# Patient Record
Sex: Female | Born: 1995 | Race: Black or African American | Hispanic: No | Marital: Single | Smoking: Never smoker
Health system: Southern US, Community
[De-identification: ages and names within clinical notes are randomized; demographics above are authoritative.]

## PROBLEM LIST (undated history)

## (undated) DIAGNOSIS — Q251 Coarctation of aorta: Secondary | ICD-10-CM

## (undated) DIAGNOSIS — J302 Other seasonal allergic rhinitis: Secondary | ICD-10-CM

## (undated) DIAGNOSIS — Q2529 Other atresia of aorta: Secondary | ICD-10-CM

---
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
## 2016-01-13 ENCOUNTER — Telehealth: Payer: Self-pay | Admitting: Family Medicine

## 2016-01-13 NOTE — Telephone Encounter (Signed)
Needs o v with me first to clarify as much as possible

## 2016-01-13 NOTE — Telephone Encounter (Signed)
Pt would like to know since we sent her to Ortho an they did not find anything via their speciality for her aches on her left side ( it is an excruciating pain ) it stops her from sleeping and she feels she just isn't getting any answers from Ortho (she has seen two separate ones)   They feel maybe she needs to see a different specialist that she maybe can get a full body scan.   She starts school back on the 28th of Aug if we could do something before then.   Please advise

## 2016-01-13 NOTE — Telephone Encounter (Signed)
Left message to return call 

## 2016-01-15 NOTE — Telephone Encounter (Signed)
Left message to return call 

## 2016-01-21 NOTE — Telephone Encounter (Signed)
Left message to return call 

## 2016-01-21 NOTE — Telephone Encounter (Signed)
Discussed with pt. Pt transferred to front to schedule ov for recheck

## 2016-01-29 ENCOUNTER — Ambulatory Visit (INDEPENDENT_AMBULATORY_CARE_PROVIDER_SITE_OTHER): Payer: 59 | Admitting: Family Medicine

## 2016-01-29 ENCOUNTER — Encounter: Payer: Self-pay | Admitting: Family Medicine

## 2016-01-29 VITALS — BP 110/68 | Ht 66.0 in | Wt 188.0 lb

## 2016-01-29 DIAGNOSIS — M25552 Pain in left hip: Secondary | ICD-10-CM | POA: Diagnosis not present

## 2016-01-29 DIAGNOSIS — G8929 Other chronic pain: Secondary | ICD-10-CM | POA: Diagnosis not present

## 2016-01-29 NOTE — Progress Notes (Signed)
   Subjective:    Patient ID: Tina Miller, female    DOB: 07-Jul-1995, 20 y.o.   MRN: [id number]  Leg Pain   The incident occurred more than 1 week ago. The pain is present in the left leg. Treatments tried: Orthopedic specialist.    Micah FlesherWent to ortho doctor a couple yrs ago  "they did not find anything"  Pain almost always at night, bad ache at times, popping sensation in the leg. Hurts really bad at times  Also went to see a neurologist, they wanted to consider nerve conduction studies  During day will ache in leg if sits for a long time  No reg ercise,   Patient in today to discuss seeing a new orthopedic specialist for left leg pain. States not other concerns this visit.  Review of Systems No headache, no major weight loss or weight gain, no chest pain no back pain abdominal pain no change in bowel habits complete ROS otherwise negative     Objective:   Physical Exam  Alert vital stable lungs clear heart rare rhythm left leg pulses sensation intact reflexes intact negative straight leg raise internal and external rotation within normal limits      Assessment & Plan:  Impression puzzling persistent left hip pain. Popping sensation at times. Worse at rest. Multiple year duration. Frustrating the patient. Saw both orthopedist and neurologist in the past who are unable to pin down the exact cause. Would like to see another orthopedic doctor

## 2016-02-05 ENCOUNTER — Encounter: Payer: Self-pay | Admitting: Family Medicine

## 2016-02-29 ENCOUNTER — Ambulatory Visit (INDEPENDENT_AMBULATORY_CARE_PROVIDER_SITE_OTHER): Payer: 59 | Admitting: Osteopathic Medicine

## 2016-02-29 VITALS — BP 118/78 | HR 80 | Temp 99.6°F | Resp 16 | Ht 66.0 in | Wt 192.2 lb

## 2016-02-29 DIAGNOSIS — Z3049 Encounter for surveillance of other contraceptives: Secondary | ICD-10-CM

## 2016-02-29 DIAGNOSIS — Z7251 High risk heterosexual behavior: Secondary | ICD-10-CM | POA: Diagnosis not present

## 2016-02-29 LAB — POCT URINE PREGNANCY: Preg Test, Ur: NEGATIVE

## 2016-02-29 MED ORDER — MEDROXYPROGESTERONE ACETATE 150 MG/ML IM SUSP
150.0000 mg | Freq: Once | INTRAMUSCULAR | Status: AC
  Administered 2016-02-29: 150 mg via INTRAMUSCULAR

## 2016-02-29 NOTE — Progress Notes (Signed)
  HPI: Tina Miller is a 20 y.o.  female  who presents to [facility] today, 02/29/16,  for chief complaint of:  Chief Complaint  Patient presents with  . Depo-Provera injection    5 days over due     Overdue for Depo  Would also like checked for gonorrhea/chlamydia   Past medical, surgical, social and family history reviewed: Past Medical History:  Diagnosis Date  . Aortic atresia or stenosis, congenital 2011   [place], [place]  . Seasonal allergies    Past Surgical History:  Procedure Laterality Date  . ADENOIDECTOMY  June 2003  . CARDIAC CATHETERIZATION  2008  . LABIOPLASTY  11/19/2011   Procedure: LABIAPLASTY;  Surgeon: Jeani HawkingMichelle L Grewal, MD;  Location: [facility];  Service: Gynecology;  Laterality: Right;  Right Labial Reduction  . TONSILLECTOMY  June 2003   Social History  Substance Use Topics  . Smoking status: Never Smoker  . Smokeless tobacco: Never Used  . Alcohol use No   Family History  Problem Relation Age of Onset  . Aortic stenosis Mother   . Diabetes Other      Current medication list and allergy/intolerance information reviewed:   Current Outpatient Prescriptions  Medication Sig Dispense Refill  . medroxyPROGESTERone (DEPO-PROVERA) 150 MG/ML injection Next injection due 08/17/15 - 08/31/15. 1 mL 3   No current facility-administered medications for this visit.    No Known Allergies    Review of Systems:  Constitutional:  No  fever, no chills, No recent illness,   Genitourinary: No  incontinence, No  abnormal genital bleeding, No abnormal genital discharge  Skin: No  Rash  Exam:  BP 118/78 (BP Location: Right Arm, Patient Position: Sitting, Cuff Size: Normal)   Pulse 80   Temp 99.6 F (37.6 C) (Oral)   Resp 16   Ht 5\' 6"  (1.676 m)   Wt 192 lb 3.2 oz (87.2 kg)   SpO2 96%   BMI 31.02 kg/m   Constitutional: VS see above. General Appearance: alert, well-developed, well-nourished, NAD   Results for orders placed or  performed in visit on 02/29/16 (from the past 72 hour(s))  POCT urine pregnancy     Status: None   Collection Time: 02/29/16 12:22 PM  Result Value Ref Range   Preg Test, Ur Negative Negative      ASSESSMENT/PLAN:   Encounter for surveillance of other contraceptive - Plan: GC/Chlamydia Probe Amp, POCT urine pregnancy, medroxyPROGESTERone (DEPO-PROVERA) injection 150 mg  Unprotected sex - Plan: GC/Chlamydia Probe Amp     Visit summary with medication list and pertinent instructions was printed for patient to review. All questions at time of visit were answered - patient instructed to contact office with any additional concerns. ER/RTC precautions were reviewed with the patient. Follow-up plan: Return in about 3 months (around 05/30/2016), or sooner if needed, for DEPO SHOT and follow-up as directed for routine care & health maintenance.

## 2016-02-29 NOTE — Patient Instructions (Addendum)
  We will call with your STD test results next week. Please let us know if you don't hear back about this. If STD positive for Gonorrhea or Chlamydia, we strongly recommend testing for HIV and other STD's - you have declined blood test for HIV today, but if you'd ever like this in the future, please let us know!    IF you received an x-ray today, you will receive an invoice from [facility]. Please contact [facility] at [phone] with questions or concerns regarding your invoice.   IF you received labwork today, you will receive an invoice from [facility]. Please contact [facility] at [phone] with questions or concerns regarding your invoice.   Our billing staff will not be able to assist you with questions regarding bills from these companies.  You will be contacted with the lab results as soon as they are available. The fastest way to get your results is to activate your My Chart account. Instructions are located on the last page of this paperwork. If you have not heard from us regarding the results in 2 weeks, please contact this office.

## 2016-03-02 LAB — GC/CHLAMYDIA PROBE AMP
CT Probe RNA: NOT DETECTED
GC PROBE AMP APTIMA: NOT DETECTED

## 2016-03-11 ENCOUNTER — Telehealth: Payer: Self-pay

## 2016-03-11 NOTE — Telephone Encounter (Signed)
Pt is looking  for lab results  Best number [phone]

## 2016-03-13 ENCOUNTER — Telehealth: Payer: Self-pay | Admitting: Emergency Medicine

## 2016-03-13 NOTE — Telephone Encounter (Signed)
Pt given negative test results 

## 2016-03-21 ENCOUNTER — Telehealth: Payer: Self-pay | Admitting: *Deleted

## 2016-03-21 NOTE — Telephone Encounter (Signed)
Patient left message on vm 'Wed 03/18/16  after calling 3 times for results.  Looks like we contacted her on 03/13/16 and left vm on 03/06/16.    Left vm today to return call to make sure that she got results.  Gonorrhea and chlamydia test were negative

## 2016-05-27 ENCOUNTER — Ambulatory Visit (INDEPENDENT_AMBULATORY_CARE_PROVIDER_SITE_OTHER): Payer: 59 | Admitting: Physician Assistant

## 2016-05-27 DIAGNOSIS — Z3042 Encounter for surveillance of injectable contraceptive: Secondary | ICD-10-CM

## 2016-05-27 DIAGNOSIS — Z3040 Encounter for surveillance of contraceptives, unspecified: Secondary | ICD-10-CM

## 2016-05-27 MED ORDER — MEDROXYPROGESTERONE ACETATE 150 MG/ML IM SUSY
150.0000 mg | PREFILLED_SYRINGE | Freq: Once | INTRAMUSCULAR | Status: AC
  Administered 2016-05-27: 150 mg via INTRAMUSCULAR

## 2016-06-09 ENCOUNTER — Encounter: Payer: Self-pay | Admitting: Family Medicine

## 2016-06-09 ENCOUNTER — Ambulatory Visit (INDEPENDENT_AMBULATORY_CARE_PROVIDER_SITE_OTHER): Payer: 59 | Admitting: Family Medicine

## 2016-06-09 VITALS — BP 100/70 | Temp 99.5°F | Ht 66.0 in | Wt 191.1 lb

## 2016-06-09 DIAGNOSIS — J329 Chronic sinusitis, unspecified: Secondary | ICD-10-CM

## 2016-06-09 DIAGNOSIS — J310 Chronic rhinitis: Secondary | ICD-10-CM

## 2016-06-09 MED ORDER — AMOXICILLIN 500 MG PO CAPS
500.0000 mg | ORAL_CAPSULE | Freq: Three times a day (TID) | ORAL | 0 refills | Status: DC
Start: 2016-06-09 — End: 2016-08-19

## 2016-06-09 NOTE — Progress Notes (Signed)
   Subjective:    Patient ID: Tina AlconAisha Brianna Miller, female    DOB: 01-Feb-1996, 20 y.o.   MRN: [id number]  Sinusitis  This is a new problem. The current episode started in the past 7 days. The problem is unchanged. Maximum temperature: 99.5. Associated symptoms include congestion, coughing, sneezing and a sore throat. Treatments tried: robitussin, nyquil. The treatment provided no relief.   Patient states that she has no other concerns at this time.  Throat irritated and scratchy  Then developed headache,  Coughing nature   Stuffy nose progressive  Pos productive  Non smoker   Review of Systems  HENT: Positive for congestion, sneezing and sore throat.   Respiratory: Positive for cough.        Objective:   Physical Exam Alert, mild malaise. Hydration good Vitals stable. frontal/ maxillary tenderness evident positive nasal congestion. pharynx normal neck supple  lungs clear/no crackles or wheezes. heart regular in rhythm        Assessment & Plan:  Impression rhinosinusitis likely post viral, discussed with patient. plan antibiotics prescribed. Questions answered. Symptomatic care discussed. warning signs discussed. WSL

## 2016-06-24 DIAGNOSIS — L03011 Cellulitis of right finger: Secondary | ICD-10-CM | POA: Diagnosis not present

## 2016-07-09 DIAGNOSIS — B351 Tinea unguium: Secondary | ICD-10-CM | POA: Diagnosis not present

## 2016-07-09 DIAGNOSIS — L608 Other nail disorders: Secondary | ICD-10-CM | POA: Diagnosis not present

## 2016-08-19 ENCOUNTER — Emergency Department (HOSPITAL_COMMUNITY): Payer: 59

## 2016-08-19 ENCOUNTER — Emergency Department (HOSPITAL_COMMUNITY)
Admission: EM | Admit: 2016-08-19 | Discharge: 2016-08-19 | Disposition: A | Discharge status: Home / Self Care | Payer: 59 | Attending: Emergency Medicine | Admitting: Emergency Medicine

## 2016-08-19 ENCOUNTER — Encounter (HOSPITAL_COMMUNITY): Payer: Self-pay | Admitting: *Deleted

## 2016-08-19 DIAGNOSIS — R0789 Other chest pain: Secondary | ICD-10-CM | POA: Diagnosis not present

## 2016-08-19 DIAGNOSIS — R079 Chest pain, unspecified: Secondary | ICD-10-CM | POA: Diagnosis not present

## 2016-08-19 DIAGNOSIS — R091 Pleurisy: Secondary | ICD-10-CM

## 2016-08-19 DIAGNOSIS — R0781 Pleurodynia: Secondary | ICD-10-CM | POA: Diagnosis not present

## 2016-08-19 LAB — I-STAT BETA HCG BLOOD, ED (MC, WL, AP ONLY)

## 2016-08-19 LAB — BASIC METABOLIC PANEL
Anion gap: 9 (ref 5–15)
BUN: 8 mg/dL (ref 6–20)
CHLORIDE: 105 mmol/L (ref 101–111)
CO2: 21 mmol/L — AB (ref 22–32)
CREATININE: 0.76 mg/dL (ref 0.44–1.00)
Calcium: 8.7 mg/dL — ABNORMAL LOW (ref 8.9–10.3)
GFR calc Af Amer: >60 mL/min (ref >60)
GFR calc non Af Amer: >60 mL/min (ref >60)
Glucose, Bld: 106 mg/dL — ABNORMAL HIGH (ref 65–99)
POTASSIUM: 3.7 mmol/L (ref 3.5–5.1)
SODIUM: 135 mmol/L (ref 135–145)

## 2016-08-19 LAB — CBC
HEMATOCRIT: 38.2 % (ref 36.0–46.0)
Hemoglobin: 13.2 g/dL (ref 12.0–15.0)
MCH: 31.1 pg (ref 26.0–34.0)
MCHC: 34.6 g/dL (ref 30.0–36.0)
MCV: 90.1 fL (ref 78.0–100.0)
PLATELETS: 175 10*3/uL (ref 150–400)
RBC: 4.24 MIL/uL (ref 3.87–5.11)
RDW: 12.7 % (ref 11.5–15.5)
WBC: 8.5 10*3/uL (ref 4.0–10.5)

## 2016-08-19 LAB — D-DIMER, QUANTITATIVE: D-Dimer, Quant: 0.4 ug/mL-FEU (ref 0.00–0.50)

## 2016-08-19 LAB — TROPONIN I: Troponin I: <0.03 ng/mL (ref <0.03)

## 2016-08-19 MED ORDER — HYDROCODONE-ACETAMINOPHEN 5-325 MG PO TABS
1.0000 | ORAL_TABLET | Freq: Once | ORAL | Status: AC
  Administered 2016-08-19: 1 via ORAL
  Filled 2016-08-19: qty 1

## 2016-08-19 MED ORDER — MORPHINE SULFATE (PF) 4 MG/ML IV SOLN
4.0000 mg | Freq: Once | INTRAVENOUS | Status: AC
  Administered 2016-08-19: 4 mg via INTRAVENOUS
  Filled 2016-08-19: qty 1

## 2016-08-19 MED ORDER — HYDROCODONE-ACETAMINOPHEN 5-325 MG PO TABS: 1.0000 | ORAL_TABLET | ORAL | 0 refills | Status: DC | PRN

## 2016-08-19 MED ORDER — IBUPROFEN 600 MG PO TABS: 600.0000 mg | ORAL_TABLET | Freq: Three times a day (TID) | ORAL | 0 refills | Status: DC | PRN

## 2016-08-19 MED ORDER — KETOROLAC TROMETHAMINE 30 MG/ML IJ SOLN
30.0000 mg | Freq: Once | INTRAMUSCULAR | Status: AC
  Administered 2016-08-19: 30 mg via INTRAVENOUS
  Filled 2016-08-19: qty 1

## 2016-08-19 NOTE — ED Triage Notes (Signed)
Pt c/o mid chest pain that is worse with deep breathing x 2 days, nausea, vomiting, syncopal episode today. Pain radiates to bilateral shoulders. Pt reports having to take shallow breaths to minimize pain. Pt has hx of open heart surgery due to aortic stenosis 6 years ago.

## 2016-08-19 NOTE — ED Provider Notes (Signed)
[facility] Provider Note   CSN: [id number] Arrival date & time: 08/19/16  1400     History   Chief Complaint Chief Complaint  Patient presents with  . Chest Pain    HPI Tina Miller is a 21 y.o. Miller.  HPI Patient is a 21 year old status post aortic valve surgery 5 years ago [facility] who presents emergency department with pleuritic chest discomfort over the past 2 days with nausea and vomiting.  No palpitations.  Patient became lightheaded today with possible syncopal episode.  No preceding palpitations.  Reports the pain is pleuritic and radiates to both shoulders.  She feels like if she takes shallow breaths her discomfort.  Past Medical History:  Diagnosis Date  . Aortic atresia or stenosis, congenital 2011   [place], [place]  . Seasonal allergies     Patient Active Problem List   Diagnosis Date Noted  . Depo contraception 06/01/2015  . 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 Aortic atresia or stenosis, congenital 02/14/2013    Past Surgical History:  Procedure Laterality Date  . ADENOIDECTOMY  June 2003  . CARDIAC CATHETERIZATION  2008  . LABIOPLASTY  11/19/2011   Procedure: LABIAPLASTY;  Surgeon: Jeani Hawking, MD;  Location: [facility];  Service: Gynecology;  Laterality: Right;  Right Labial Reduction  . TONSILLECTOMY  June 2003    OB History    No data available       Home Medications    Prior to Admission medications   Medication Sig Start Date End Date Taking? Authorizing Provider  HYDROcodone-acetaminophen (NORCO/VICODIN) 5-325 MG tablet Take 1 tablet by mouth every 4 (four) hours as needed for moderate pain. 08/19/16   Azalia Bilis, MD  ibuprofen (ADVIL,MOTRIN) 600 MG tablet Take 1 tablet (600 mg total) by mouth every 8 (eight) hours as needed. 08/19/16   Azalia Bilis, MD  medroxyPROGESTERone (DEPO-PROVERA) 150 MG/ML injection Next injection due 08/17/15 -  08/31/15. 06/01/15   Dorna Leitz, PA-C    Family History Family History  Problem Relation Age of Onset  . Aortic stenosis Mother   . Diabetes Other     Social History Social History  Substance Use Topics  . Smoking status: Never Smoker  . Smokeless tobacco: Never Used  . Alcohol use No     Allergies   Lasix [furosemide]   Review of Systems Review of Systems  All other systems reviewed and are negative.    Physical Exam Updated Vital Signs BP 109/73   Pulse 89   Temp 98.9 F (37.2 C) (Oral)   Resp (!) 31   Ht 5\' 6"  (1.676 m)   Wt 191 lb (86.6 kg)   SpO2 99%   BMI 30.83 kg/m   Physical Exam  Constitutional: She is oriented to person, place, and time. She appears well-developed and well-nourished. No distress.  HENT:  Head: Normocephalic and atraumatic.  Eyes: EOM are normal.  Neck: Normal range of motion.  Cardiovascular: Normal rate, regular rhythm and normal heart sounds.   Pulmonary/Chest: Effort normal and breath sounds normal.  Abdominal: Soft. She exhibits no distension. There is no tenderness.  Musculoskeletal: Normal range of motion. She exhibits no edema or tenderness.  Neurological: She is alert and oriented to person, place, and time.  Skin: Skin is warm and dry.  Psychiatric: She has a normal mood and affect. Judgment normal.  Nursing note and vitals reviewed.    ED  Treatments / Results  Labs (all labs ordered are listed, but only abnormal results are displayed) Labs Reviewed  BASIC METABOLIC PANEL - Abnormal; Notable for the following:       Result Value   CO2 21 (*)    Glucose, Bld 106 (*)    Calcium 8.7 (*)    All other components within normal limits  CBC  TROPONIN I  D-DIMER, QUANTITATIVE (NOT AT [facility])  I-STAT BETA HCG BLOOD, ED ([facility], [facility], [facility] ONLY)    EKG  EKG Interpretation  Date/Time:  Wednesday August 19 2016 14:04:55 EST Ventricular Rate:  97 PR Interval:  144 QRS Duration: 70 QT Interval:  330 QTC Calculation: 419 R  Axis:   60 Text Interpretation:  Normal sinus rhythm Normal ECG No significant change was found Confirmed by Chanse Kagel  MD, Caryn BeeKEVIN ([id number]) on 08/19/2016 2:38:46 PM       Radiology Dg Chest 2 View  Result Date: 08/19/2016 CLINICAL DATA:  Chest pain for 2 days EXAM: CHEST  2 VIEW COMPARISON:  06/06/2014 FINDINGS: Postsurgical changes are again seen. Cardiac shadow is stable. The lungs are well aerated bilaterally. No bony abnormality is seen. IMPRESSION: No active cardiopulmonary disease. Electronically Signed   By: Alcide CleverMark  Lukens M.D.   On: 08/19/2016 14:41    Procedures Procedures (including critical care time)  Medications Ordered in ED Medications  ketorolac (TORADOL) 30 MG/ML injection 30 mg (30 mg Intravenous Given 08/19/16 1624)  morphine 4 MG/ML injection 4 mg (4 mg Intravenous Given 08/19/16 1624)  HYDROcodone-acetaminophen (NORCO/VICODIN) 5-325 MG per tablet 1 tablet (1 tablet Oral Given 08/19/16 1737)     Initial Impression / Assessment and Plan / ED Course  I have reviewed the triage vital signs and the nursing notes.  Pertinent labs & imaging results that were available during my care of the patient were reviewed by me and considered in my medical decision making (see chart for details).     Patient is well-appearing.  D-dimer is negative.  Nothing to suggest ACS in this young healthy Miller.  This is likely pleurisy or pericarditis.  Improvement in symptoms in the ER.  Home with anti-inflammatories and short course pain medication.  Primary care follow-up.  No indication for additional workup or hospitalization at this time  Final Clinical Impressions(s) / ED Diagnoses   Final diagnoses:  Pleuritic chest pain  Pleurisy    New Prescriptions New Prescriptions   HYDROCODONE-ACETAMINOPHEN (NORCO/VICODIN) 5-325 MG TABLET    Take 1 tablet by mouth every 4 (four) hours as needed for moderate pain.   IBUPROFEN (ADVIL,MOTRIN) 600 MG TABLET    Take 1 tablet (600 mg total) by mouth every  8 (eight) hours as needed.     Azalia BilisKevin Seleena Reimers, MD 08/19/16 1900

## 2016-08-21 ENCOUNTER — Emergency Department (HOSPITAL_COMMUNITY)
Admission: EM | Admit: 2016-08-21 | Discharge: 2016-08-22 | Disposition: A | Discharge status: Home / Self Care | Payer: 59 | Attending: Emergency Medicine | Admitting: Emergency Medicine

## 2016-08-21 ENCOUNTER — Encounter (HOSPITAL_COMMUNITY): Payer: Self-pay | Admitting: Emergency Medicine

## 2016-08-21 ENCOUNTER — Emergency Department (HOSPITAL_COMMUNITY): Payer: 59

## 2016-08-21 DIAGNOSIS — Z79899 Other long term (current) drug therapy: Secondary | ICD-10-CM | POA: Insufficient documentation

## 2016-08-21 DIAGNOSIS — R0781 Pleurodynia: Secondary | ICD-10-CM | POA: Diagnosis not present

## 2016-08-21 DIAGNOSIS — R079 Chest pain, unspecified: Secondary | ICD-10-CM | POA: Diagnosis not present

## 2016-08-21 DIAGNOSIS — J90 Pleural effusion, not elsewhere classified: Secondary | ICD-10-CM

## 2016-08-21 MED ORDER — DOXYCYCLINE HYCLATE 100 MG PO TABS
100.0000 mg | ORAL_TABLET | Freq: Once | ORAL | Status: AC
  Administered 2016-08-21: 100 mg via ORAL
  Filled 2016-08-21: qty 1

## 2016-08-21 MED ORDER — DEXTROSE 5 % IV SOLN
1.0000 g | Freq: Once | INTRAVENOUS | Status: AC
  Administered 2016-08-21: 1 g via INTRAVENOUS
  Filled 2016-08-21: qty 10

## 2016-08-21 MED ORDER — IBUPROFEN 600 MG PO TABS: 600.0000 mg | ORAL_TABLET | Freq: Four times a day (QID) | ORAL | 0 refills | Status: DC | PRN

## 2016-08-21 MED ORDER — KETOROLAC TROMETHAMINE 30 MG/ML IJ SOLN
30.0000 mg | Freq: Once | INTRAMUSCULAR | Status: AC
  Administered 2016-08-21: 30 mg via INTRAVENOUS
  Filled 2016-08-21: qty 1

## 2016-08-21 MED ORDER — IOPAMIDOL (ISOVUE-370) INJECTION 76%
100.0000 mL | Freq: Once | INTRAVENOUS | Status: AC | PRN
  Administered 2016-08-21: 100 mL via INTRAVENOUS

## 2016-08-21 MED ORDER — DOXYCYCLINE HYCLATE 100 MG PO CAPS: 100.0000 mg | ORAL_CAPSULE | Freq: Two times a day (BID) | ORAL | 0 refills | Status: DC

## 2016-08-21 NOTE — Discharge Instructions (Signed)
Motrin for pain Doxycycline twice daily for 10 days ER if your symptoms worsen.

## 2016-08-21 NOTE — ED Provider Notes (Signed)
[facility] Provider Note   CSN: [id number] Arrival date & time: 08/21/16  2053     History   Chief Complaint Chief Complaint  Patient presents with  . Chest Pain    HPI Tina Miller is a 21 y.o. female.  HPI  The patient is a 21 year old female, she has a history consistent with only having aortic stenosis which was congenital and required artificial valve replacement within the last 5 years. She is otherwise healthy, takes no other daily medications and has no other confounding medical problems. She initially presented 2 days ago with an pleuritic chest pain which seemed to be worse with taking a deep breath, radiated to the bilateral shoulders and had initial workup which was negative for things including troponin, d-dimer and her x-ray. Her EKG showed no ischemia. She states that she did get better but today approximately one or 2 hours ago she developed recurrent sharp and stabbing pain in the middle of the chest, states that radiates to the bilateral shoulders, seems to get worse with taking a deep breath. She denies birth control, she is on Depo-Provera injection, she does not smoke cigarettes.  And has no swelling of the lower extremities, no recent surgery, immobilization, travel or vascular injury. She states that the pain is fairly severe at this time and she is tearful. She reports that she is very active and healthy and exerts herself frequently at the gym exercising and never has any exertional chest pain or shortness of breath and denies any recent fevers chills or coughing  Past Medical History:  Diagnosis Date  . Aortic atresia or stenosis, congenital 2011   [place], [place]  . Seasonal allergies     Patient Active Problem List   Diagnosis Date Noted  . Depo contraception 06/01/2015  . 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 Aortic atresia or stenosis, congenital 02/14/2013     Past Surgical History:  Procedure Laterality Date  . ADENOIDECTOMY  June 2003  . CARDIAC CATHETERIZATION  2008  . LABIOPLASTY  11/19/2011   Procedure: LABIAPLASTY;  Surgeon: Jeani HawkingMichelle L Grewal, MD;  Location: [facility];  Service: Gynecology;  Laterality: Right;  Right Labial Reduction  . TONSILLECTOMY  June 2003    OB History    No data available       Home Medications    Prior to Admission medications   Medication Sig Start Date End Date Taking? Authorizing Provider  HYDROcodone-acetaminophen (NORCO/VICODIN) 5-325 MG tablet Take 1 tablet by mouth every 4 (four) hours as needed for moderate pain. 08/19/16  Yes Azalia BilisKevin Campos, MD  medroxyPROGESTERone (DEPO-PROVERA) 150 MG/ML injection Next injection due 08/17/15 - 08/31/15. Patient taking differently: No sig reported 06/01/15  Yes Lanier ClamNicole Bush V, PA-C  doxycycline (VIBRAMYCIN) 100 MG capsule Take 1 capsule (100 mg total) by mouth 2 (two) times daily. 08/21/16   Eber HongBrian Nyasiah Moffet, MD  ibuprofen (ADVIL,MOTRIN) 600 MG tablet Take 1 tablet (600 mg total) by mouth every 6 (six) hours as needed. 08/21/16   Eber HongBrian Zhaniya Swallows, MD    Family History Family History  Problem Relation Age of Onset  . Aortic stenosis Mother   . Diabetes Other     Social History Social History  Substance Use Topics  . Smoking status: Never Smoker  . Smokeless tobacco: Never Used  . Alcohol use No     Allergies   Lasix [furosemide]   Review of Systems Review of Systems  All other systems reviewed and are negative.    Physical Exam Updated Vital Signs BP 114/68 (BP Location: Left Arm)   Pulse 74   Temp 99.2 F (37.3 C) (Oral)   Resp 23   Ht 5\' 6"  (1.676 m)   Wt 189 lb (85.7 kg)   SpO2 99%   BMI 30.51 kg/m   Physical Exam  Constitutional: She appears well-developed and well-nourished.  Tearful  HENT:  Head: Normocephalic and atraumatic.  Mouth/Throat: Oropharynx is clear and moist. No oropharyngeal exudate.  Eyes: Conjunctivae and EOM are normal. Pupils  are equal, round, and reactive to light. Right eye exhibits no discharge. Left eye exhibits no discharge. No scleral icterus.  Neck: Normal range of motion. Neck supple. No JVD present. No thyromegaly present.  Cardiovascular: Normal rate, regular rhythm, normal heart sounds and intact distal pulses.  Exam reveals no gallop and no friction rub.   No murmur heard. No murmurs, no rubs  Pulmonary/Chest: Effort normal and breath sounds normal. No respiratory distress. She has no wheezes. She has no rales.  Deep breathing limited by pain, no reproducible tenderness over the chest [place], chaperone present for exam  Abdominal: Soft. Bowel sounds are normal. She exhibits no distension and no mass. There is no tenderness.  Musculoskeletal: Normal range of motion. She exhibits no edema or tenderness.  There is no edema or asymmetry or tenderness of the legs  Lymphadenopathy:    She has no cervical adenopathy.  Neurological: She is alert. Coordination normal.  Skin: Skin is warm and dry. No rash noted. No erythema.  Psychiatric: She has a normal mood and affect. Her behavior is normal.  Nursing note and vitals reviewed.    ED Treatments / Results  Labs (all labs ordered are listed, but only abnormal results are displayed) Labs Reviewed - No data to display  EKG  EKG Interpretation None       Radiology Ct Angio Chest Pe W Or Wo Contrast  Result Date: 08/21/2016 CLINICAL DATA:  Pain in the chest and shoulders EXAM: CT ANGIOGRAPHY CHEST WITH CONTRAST TECHNIQUE: Multidetector CT imaging of the chest was performed using the standard protocol during bolus administration of intravenous contrast. Multiplanar CT image reconstructions and MIPs were obtained to evaluate the vascular anatomy. CONTRAST:  100 mL Isovue 370 intravenous COMPARISON:  03/07/ 2018 FINDINGS: Cardiovascular: Satisfactory opacification of the pulmonary arteries to the segmental level. No evidence of pulmonary embolism. There is  cardiomegaly. No large pericardial effusion. Mediastinum/Nodes: No enlarged mediastinal, hilar, or axillary lymph nodes. Thyroid gland, trachea, and esophagus demonstrate no significant findings. Lungs/Pleura: Small bilateral right greater than left pleural effusions. Hazy atelectasis or infiltrates in the right greater than left lung bases. No pneumothorax. Upper Abdomen: No acute abnormality. Musculoskeletal: Post sternotomy changes. No acute osseous abnormality. Review of the MIP images confirms the above findings. IMPRESSION: 1. Negative for acute pulmonary embolus or aortic dissection 2. Small bilateral pleural effusions right greater than left with streaky atelectasis or infiltrates in the bilateral lower lobes. 3. Cardiomegaly.  Post sternotomy changes. Electronically Signed   By: Jasmine Pang M.D.   On: 08/21/2016 22:44    Procedures Procedures (including critical care time)  Medications Ordered in ED Medications  cefTRIAXone (ROCEPHIN) 1 g in dextrose 5 % 50 mL IVPB (1 g Intravenous New Bag/Given 08/21/16 2329)  ketorolac (TORADOL) 30 MG/ML injection 30 mg (30 mg Intravenous Given 08/21/16 2119)  iopamidol (ISOVUE-370) 76 % injection 100 mL (100 mLs Intravenous Contrast Given 08/21/16 2215)  doxycycline (VIBRA-TABS) tablet 100 mg (100 mg Oral Given 08/21/16 2336)     Initial Impression / Assessment and Plan / ED Course  I have reviewed the triage vital signs and the nursing notes.  Pertinent labs & imaging results that were available during my care of the patient were reviewed by me and considered in my medical decision making (see chart for details).    CP, low risk for PE or ACS - d dimer neg 2 days ago but very symptomatic, ECG reassuring - CT angio pending  CT scan of the chest shows that the patient has a right greater than left pleural effusion, atelectasis versus infiltrate at the bases, otherwise the patient feels better after medication, she is not short of breath hypoxic or  tachycardic. I have shared all of her results with her and her family members and have given them a paper copy of the CT scan report. She will need close follow-up. Her family doctor is local and can follow-up with this. Patient is stable for discharge at this time. He was given the medications as below  ED ECG REPORT  I personally interpreted this EKG   Date: 08/21/2016   Rate: 81  Rhythm: normal sinus rhythm  QRS Axis: normal  Intervals: normal  ST/T Wave abnormalities: normal  Conduction Disutrbances:none  Narrative Interpretation:   Old EKG Reviewed: no changes from prior  Final Clinical Impressions(s) / ED Diagnoses   Final diagnoses:  Pleural effusion  Pleurisy with effusion    New Prescriptions New Prescriptions   DOXYCYCLINE (VIBRAMYCIN) 100 MG CAPSULE    Take 1 capsule (100 mg total) by mouth 2 (two) times daily.   IBUPROFEN (ADVIL,MOTRIN) 600 MG TABLET    Take 1 tablet (600 mg total) by mouth every 6 (six) hours as needed.     Eber Hong, MD 08/21/16 [phone]

## 2016-08-21 NOTE — ED Triage Notes (Signed)
Pt states that she was seen here on 08/19/2016 and told that she has pleurisy.  Pt states that today she is having pain in her chest and shoulders.  Pain increases with movement and deep breathing.

## 2016-08-21 NOTE — ED Notes (Signed)
Pt sitting in bed eating graham crackers, smiling and talking.  No distress in breathing or significant discomfort noted at this time.

## 2016-08-22 ENCOUNTER — Ambulatory Visit (INDEPENDENT_AMBULATORY_CARE_PROVIDER_SITE_OTHER): Payer: 59 | Admitting: Physician Assistant

## 2016-08-22 DIAGNOSIS — Z3042 Encounter for surveillance of injectable contraceptive: Secondary | ICD-10-CM

## 2016-08-22 MED ORDER — MEDROXYPROGESTERONE ACETATE 150 MG/ML IM SUSP
150.0000 mg | Freq: Once | INTRAMUSCULAR | Status: AC
  Administered 2016-08-22: 150 mg via INTRAMUSCULAR

## 2016-08-22 NOTE — Progress Notes (Signed)
Pt came in today for her Depo Provera injection. She was within the window.

## 2016-08-22 NOTE — ED Notes (Signed)
PT states understanding of care given, follow up care, and medication prescribed. PT ambulated from ED to car with a steady gait. 

## 2016-08-24 ENCOUNTER — Ambulatory Visit: Payer: 59

## 2016-08-25 ENCOUNTER — Telehealth: Payer: Self-pay | Admitting: Family Medicine

## 2016-08-25 DIAGNOSIS — J189 Pneumonia, unspecified organism: Secondary | ICD-10-CM

## 2016-08-25 NOTE — Telephone Encounter (Signed)
Chest xray ordered at [facility], Patient notified

## 2016-08-25 NOTE — Telephone Encounter (Signed)
Patient was seen at the ER on 08/19/16 and 08/21/16.  She was diagnosed with pneumonia amd pleurisy.  Patient was told by the ER doctor that they were going to send Dr. Brett CanalesSteve the the information so that we can schedule a follow up x-ray in 2 weeks.  She has gone back to school 4 hours away, but would like this scheduled on a Friday afternoon most likely.  The antibiotics and motrin is helping.

## 2016-08-25 NOTE — Telephone Encounter (Signed)
Ok lets do 

## 2016-08-26 NOTE — Progress Notes (Signed)
Depo provera injection

## 2016-09-25 ENCOUNTER — Ambulatory Visit (HOSPITAL_COMMUNITY)
Admission: RE | Admit: 2016-09-25 | Discharge: 2016-09-25 | Disposition: A | Discharge status: Home / Self Care | Payer: 59 | Source: Ambulatory Visit | Attending: Family Medicine | Admitting: Family Medicine

## 2016-09-25 DIAGNOSIS — J189 Pneumonia, unspecified organism: Secondary | ICD-10-CM | POA: Insufficient documentation

## 2016-09-25 DIAGNOSIS — I517 Cardiomegaly: Secondary | ICD-10-CM | POA: Insufficient documentation

## 2016-09-30 ENCOUNTER — Telehealth: Payer: Self-pay | Admitting: Family Medicine

## 2016-09-30 NOTE — Telephone Encounter (Signed)
Patient completed chest x-ray on 09/25/16.  She got the results, but she is wanting to know if there was any fluid showing at all?

## 2016-09-30 NOTE — Telephone Encounter (Signed)
Called pt and discussed results of xray. See xray results.

## 2016-10-26 DIAGNOSIS — M25552 Pain in left hip: Secondary | ICD-10-CM | POA: Diagnosis not present

## 2016-10-26 DIAGNOSIS — Z888 Allergy status to other drugs, medicaments and biological substances status: Secondary | ICD-10-CM | POA: Diagnosis not present

## 2017-01-18 DIAGNOSIS — Z01419 Encounter for gynecological examination (general) (routine) without abnormal findings: Secondary | ICD-10-CM | POA: Diagnosis not present

## 2017-01-18 DIAGNOSIS — R87610 Atypical squamous cells of undetermined significance on cytologic smear of cervix (ASC-US): Secondary | ICD-10-CM | POA: Diagnosis not present

## 2017-02-03 ENCOUNTER — Ambulatory Visit (INDEPENDENT_AMBULATORY_CARE_PROVIDER_SITE_OTHER): Payer: 59 | Admitting: Family Medicine

## 2017-02-03 ENCOUNTER — Encounter: Payer: Self-pay | Admitting: Family Medicine

## 2017-02-03 VITALS — BP 102/62 | Temp 98.6°F | Ht 66.0 in | Wt 183.6 lb

## 2017-02-03 DIAGNOSIS — J329 Chronic sinusitis, unspecified: Secondary | ICD-10-CM

## 2017-02-03 DIAGNOSIS — J310 Chronic rhinitis: Secondary | ICD-10-CM

## 2017-02-03 MED ORDER — CEFDINIR 300 MG PO CAPS: 300.0000 mg | ORAL_CAPSULE | Freq: Two times a day (BID) | ORAL | 0 refills | Status: DC

## 2017-02-03 NOTE — Progress Notes (Signed)
   Subjective:    Patient ID: Tina Miller, female    DOB: 01-21-1996, 21 y.o.   MRN: [id number]  Cough  This is a new problem. The current episode started in the past 7 days. Associated symptoms include ear pain, headaches, nasal congestion and a sore throat. Treatments tried: nose spray and motrin.   Sore throat fairly sig five d ago,  Then couple d later got sicker  Now substantial cough   Runny nose and gunky and dim energy  Headache frontal, achey and the sharp , worse with motion  mucinex and nasl spray not helping much   Majoring in pychology   Energy puny  Results for orders placed or performed during the hospital encounter of 08/19/16  Basic metabolic panel  Result Value Ref Range   Sodium 135 135 - 145 mmol/L   Potassium 3.7 3.5 - 5.1 mmol/L   Chloride 105 101 - 111 mmol/L   CO2 21 (L) 22 - 32 mmol/L   Glucose, Bld 106 (H) 65 - 99 mg/dL   BUN 8 6 - 20 mg/dL   Creatinine, Ser 4.85 0.44 - 1.00 mg/dL   Calcium 8.7 (L) 8.9 - 10.3 mg/dL   GFR calc non Af Amer >60 >60 mL/min   GFR calc Af Amer >60 >60 mL/min   Anion gap 9 5 - 15  CBC  Result Value Ref Range   WBC 8.5 4.0 - 10.5 K/uL   RBC 4.24 3.87 - 5.11 MIL/uL   Hemoglobin 13.2 12.0 - 15.0 g/dL   HCT 46.2 70.3 - 50.0 %   MCV 90.1 78.0 - 100.0 fL   MCH 31.1 26.0 - 34.0 pg   MCHC 34.6 30.0 - 36.0 g/dL   RDW 93.8 18.2 - 99.3 %   Platelets 175 150 - 400 K/uL  Troponin I  Result Value Ref Range   Troponin I <0.03 <0.03 ng/mL  D-dimer, quantitative (not at [facility])  Result Value Ref Range   D-Dimer, Quant 0.40 0.00 - 0.50 ug/mL-FEU  I-Stat Beta hCG blood, ED ([facility], [facility], [facility] only)  Result Value Ref Range   I-stat hCG, quantitative <5.0 <5 mIU/mL   Comment 3             Review of Systems  HENT: Positive for ear pain and sore throat.   Respiratory: Positive for cough.   Neurological: Positive for headaches.       Objective:   Physical Exam  Alert, mild malaise. Hydration good Vitals stable. frontal/  maxillary tenderness evident positive nasal congestion. pharynx normal neck supple  lungs clear/no crackles or wheezes. heart regular in rhythm       Assessment & Plan:  Impression rhinosinusitis likely post viral, discussed with patient. plan antibiotics prescribed. Questions answered. Symptomatic care discussed. warning signs discussed. WSL

## 2017-05-13 DIAGNOSIS — R87612 Low grade squamous intraepithelial lesion on cytologic smear of cervix (LGSIL): Secondary | ICD-10-CM | POA: Diagnosis not present

## 2017-05-13 DIAGNOSIS — N760 Acute vaginitis: Secondary | ICD-10-CM | POA: Diagnosis not present

## 2017-05-13 DIAGNOSIS — R87610 Atypical squamous cells of undetermined significance on cytologic smear of cervix (ASC-US): Secondary | ICD-10-CM | POA: Diagnosis not present

## 2017-07-08 ENCOUNTER — Encounter: Payer: Self-pay | Admitting: Family Medicine

## 2017-07-08 ENCOUNTER — Ambulatory Visit: Payer: 59 | Admitting: Nurse Practitioner

## 2017-07-08 ENCOUNTER — Encounter: Payer: Self-pay | Admitting: Nurse Practitioner

## 2017-07-08 VITALS — BP 122/74 | Temp 98.8°F | Ht 66.0 in | Wt 182.0 lb

## 2017-07-08 DIAGNOSIS — J069 Acute upper respiratory infection, unspecified: Secondary | ICD-10-CM

## 2017-07-08 MED ORDER — AZITHROMYCIN 250 MG PO TABS: ORAL_TABLET | ORAL | 0 refills | Status: DC

## 2017-07-10 ENCOUNTER — Encounter: Payer: Self-pay | Admitting: Nurse Practitioner

## 2017-07-10 NOTE — Progress Notes (Signed)
Subjective: Presents for complaints of cold symptoms that began 2 days ago.  Complaints of sore throat headache runny nose and occasional nonproductive cough.  Sneezing.  No wheezing.  No ear pain.  No vomiting.  Has had 2 episodes of diarrhea.  No abdominal pain.  Taking fluids well.  Voiding normal limit.  Objective:   BP 122/74   Temp 98.8 F (37.1 C) (Oral)   Ht 5\' 6"  (1.676 m)   Wt 182 lb (82.6 kg)   BMI 29.38 kg/m  NAD.  Alert, oriented.  TMs retracted, no erythema.  Pharynx nonerythematous with clear PND noted.  Neck supple with mild soft anterior adenopathy.  Lungs clear.  Heart regular rate and rhythm.  Abdomen soft nontender.  Assessment:  Upper respiratory infection, acute    Plan:   Meds ordered this encounter  Medications  . azithromycin (ZITHROMAX Z-PAK) 250 MG tablet    Sig: Take 2 tablets (500 mg) on  Day 1,  followed by 1 tablet (250 mg) once daily on Days 2 through 5.    Dispense:  6 each    Refill:  0    Order Specific Question:   Supervising Provider    Answer:   Riccardo DubinLUKING, WILLIAM S [[id number]]   Given written prescription for a Z-Pak to have over the weekend in case symptoms worsen.  Reviewed symptomatic care and warning signs.  Call back next week if no improvement, sooner if worse.  Continue OTC meds as directed.

## 2017-08-03 DIAGNOSIS — N390 Urinary tract infection, site not specified: Secondary | ICD-10-CM | POA: Diagnosis not present

## 2017-09-21 ENCOUNTER — Ambulatory Visit: Payer: 59 | Admitting: Family Medicine

## 2017-09-21 ENCOUNTER — Encounter: Payer: Self-pay | Admitting: Family Medicine

## 2017-09-21 VITALS — BP 108/68 | Temp 98.7°F | Ht 66.0 in | Wt 182.0 lb

## 2017-09-21 DIAGNOSIS — J019 Acute sinusitis, unspecified: Secondary | ICD-10-CM

## 2017-09-21 MED ORDER — AMOXICILLIN 500 MG PO TABS: 500.0000 mg | ORAL_TABLET | Freq: Three times a day (TID) | ORAL | 0 refills | Status: DC

## 2017-09-21 NOTE — Progress Notes (Addendum)
   Subjective:    Patient ID: Tina Miller, female    DOB: June 15, 1996, 22 y.o.   MRN: [id number]  HPI  Patient is here today with complaints of a cough, sinus pressure, sore throat, no fever has been on going since Monday 09/20/2017, she has been taking mucinex  Head congestion drainage coughing sinus pressure no wheezing no difficulty breathing energy level subpar no vomiting or diarrhea PMH aortic stenosis followed by cardiology Review of Systems  Constitutional: Negative for activity change and fever.  HENT: Positive for congestion and rhinorrhea. Negative for ear pain.   Eyes: Negative for discharge.  Respiratory: Positive for cough. Negative for shortness of breath and wheezing.   Cardiovascular: Negative for chest pain.       Objective:   Physical Exam  Constitutional: She appears well-developed.  HENT:  Head: Normocephalic.  Right Ear: External ear normal.  Left Ear: External ear normal.  Nose: Nose normal.  Mouth/Throat: Oropharynx is clear and moist. No oropharyngeal exudate.  Eyes: Right eye exhibits no discharge. Left eye exhibits no discharge.  Neck: Neck supple. No tracheal deviation present.  Cardiovascular: Normal rate.  Murmur heard. Pulmonary/Chest: Effort normal and breath sounds normal. She has no wheezes. She has no rales.  Lymphadenopathy:    She has no cervical adenopathy.  Skin: Skin is warm and dry.  Nursing note and vitals reviewed.         Assessment & Plan:  Aortic stenosis followed by cardiologyPatient was seen today for upper respiratory illness. It is felt that the patient is dealing with sinusitis. Antibiotics were prescribed today. Importance of compliance with medication was discussed. Symptoms should gradually resolve over the course of the next several days. If high fevers, progressive illness, difficulty breathing, worsening condition or failure for symptoms to improve over the next several days then the patient is to follow-up. If any  emergent conditions the patient is to follow-up in the emergency department otherwise to follow-up in the office.    Patient states that she will be graduating college and states that she might be moving then this will occur in approximately 2 years at that point time I encouraged her to have a regular primary care doctor and also establish herself with adult cardiology

## 2017-12-04 IMAGING — CT CT ANGIO CHEST
2 of 6 series · 19 of 36 positions shown · IV contrast (Isovue)
Comparison: [DATE]

CLINICAL DATA: Pain in the chest and shoulders

EXAM:
CT ANGIOGRAPHY CHEST WITH CONTRAST
TECHNIQUE: Multidetector CT imaging of the chest was performed using the
standard protocol during bolus administration of intravenous
contrast. Multiplanar CT image reconstructions and MIPs were
obtained to evaluate the vascular anatomy.
CONTRAST:  100 mL Isovue 370 intravenous

[Series 5: thins · axial · 0.73mm/px · z∈[-511,-256]mm · 18 of 285 slices shown]
[im 15/285  lung]
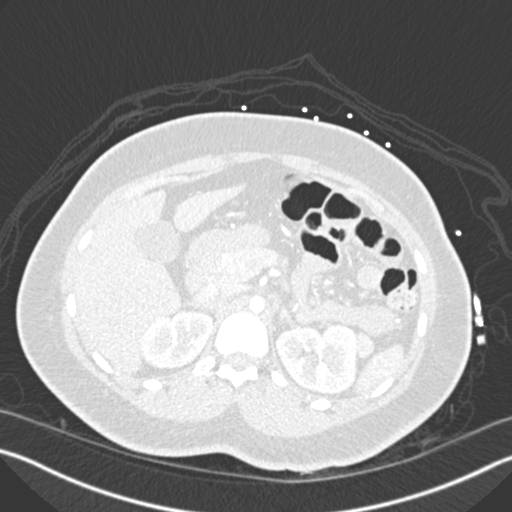
[im 29/285  mediastinal]
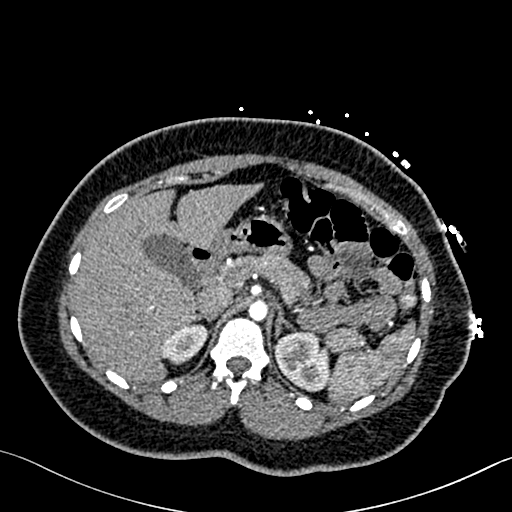
[im 43/285  lung]
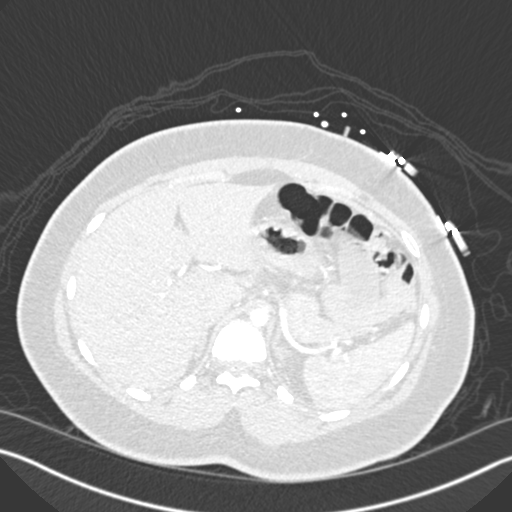
[im 57/285  mediastinal]
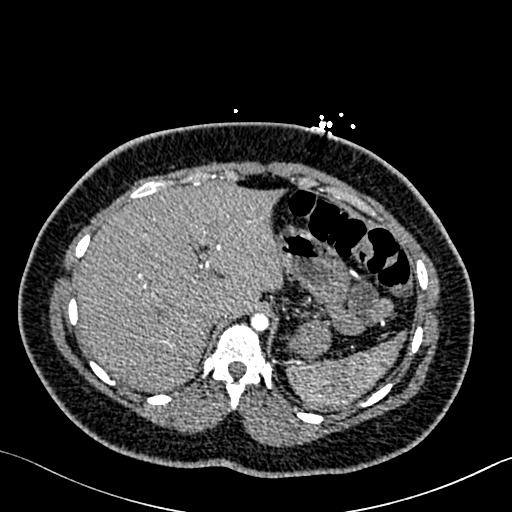
[im 72/285  lung]
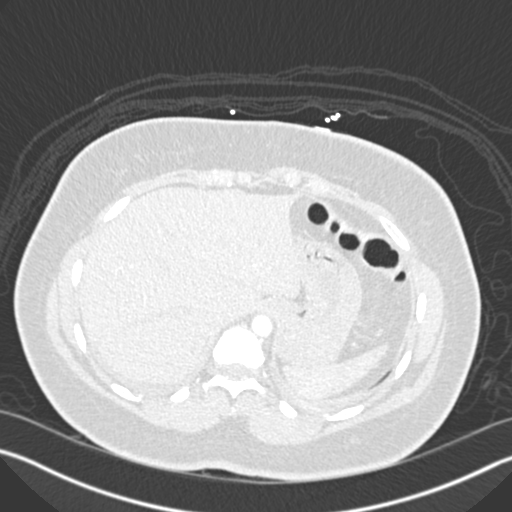
[im 86/285  mediastinal]
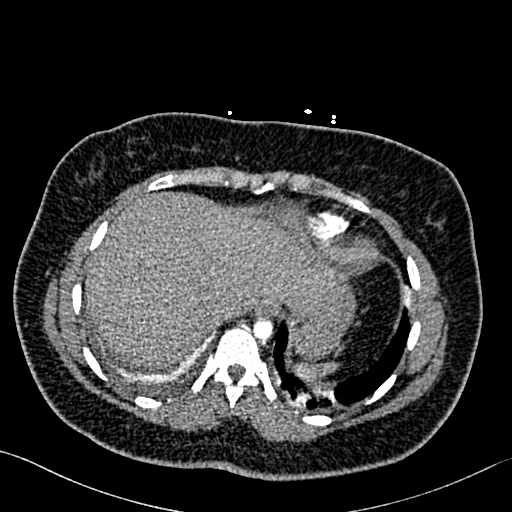
[im 100/285  lung]
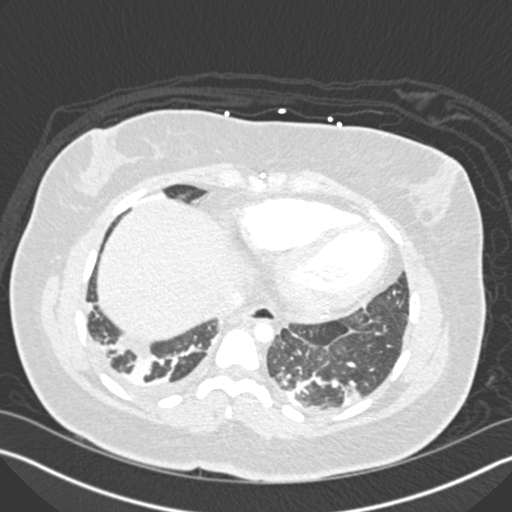
[im 114/285  mediastinal]
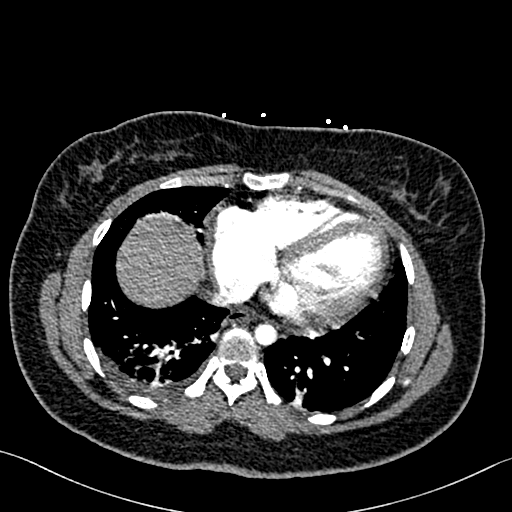
[im 128/285  lung]
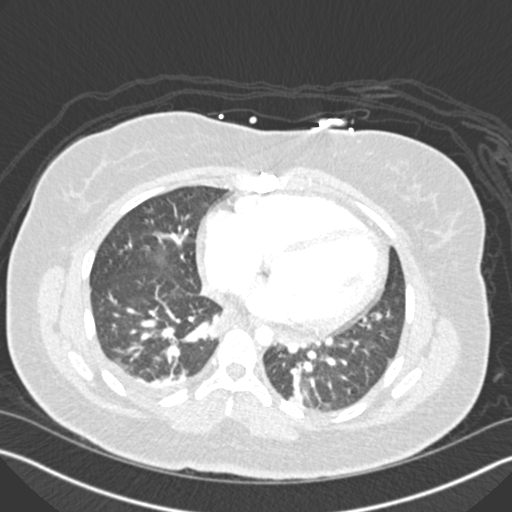
[im 157/285  mediastinal]
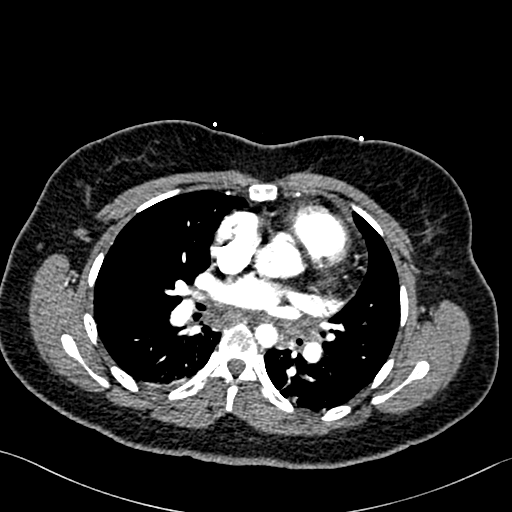
[im 171/285  lung]
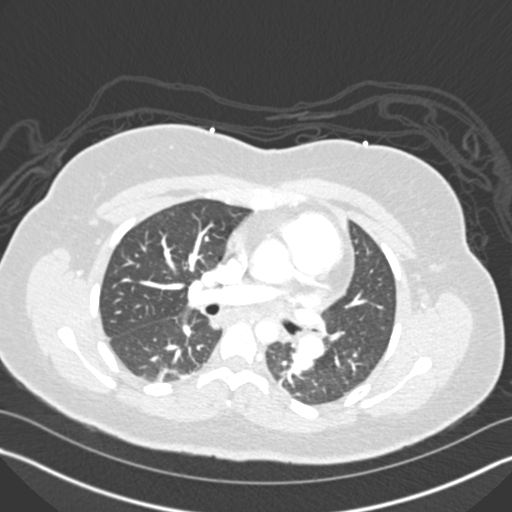
[im 185/285  mediastinal]
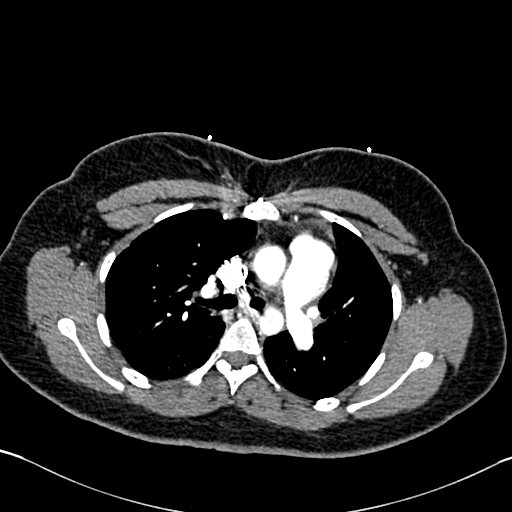
[im 199/285  lung]
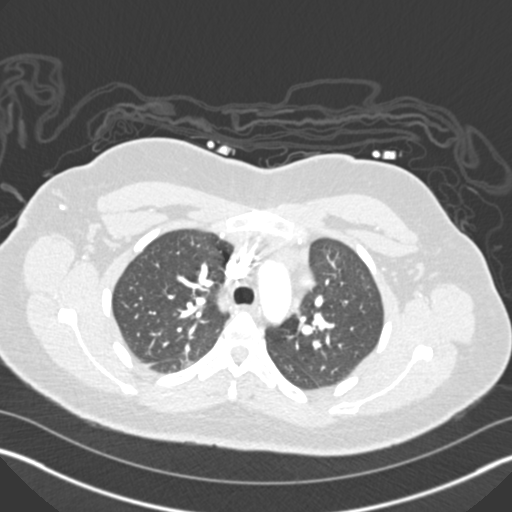
[im 214/285  mediastinal]
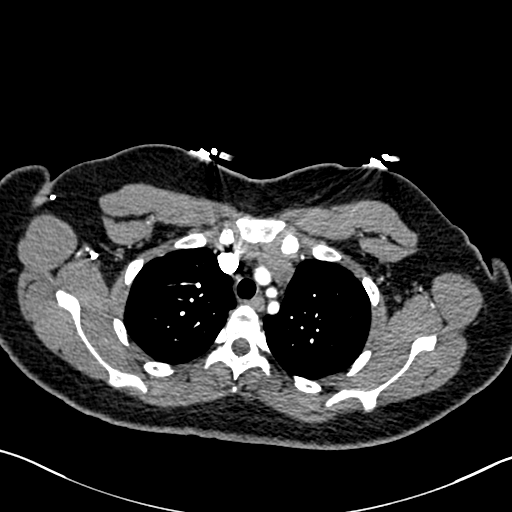
[im 228/285  lung]
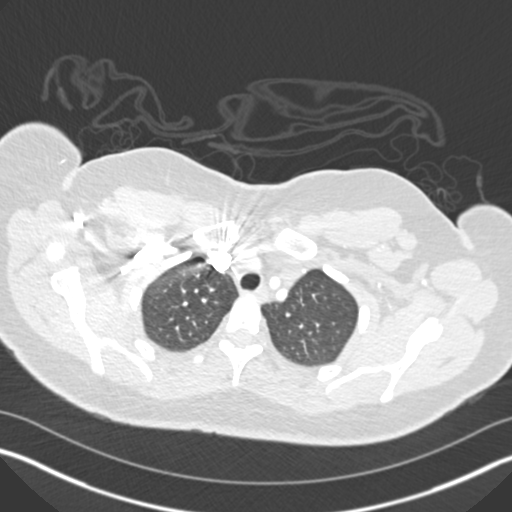
[im 242/285  mediastinal]
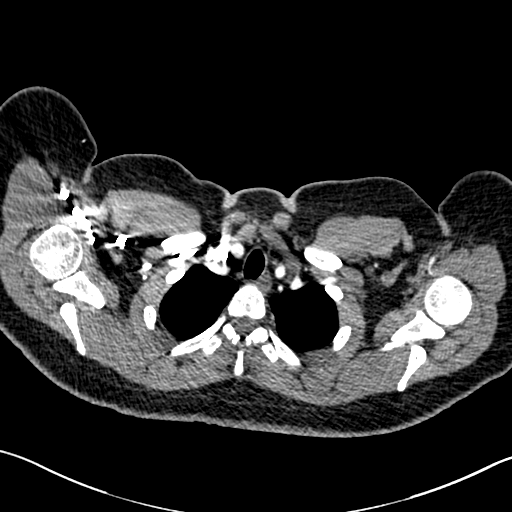
[im 256/285  lung]
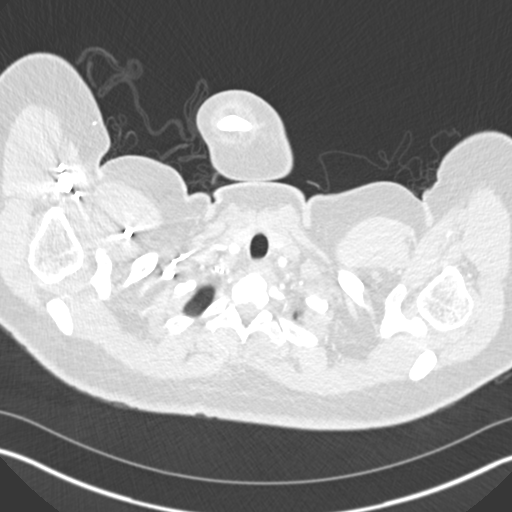
[im 270/285  mediastinal]
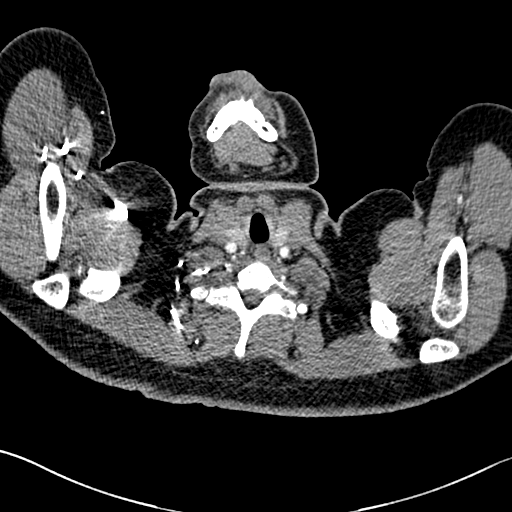

[Series 7: coronal mpr · coronal · 0.60mm/px · 1 of 151 slices shown]
[im 76/151  mediastinal]
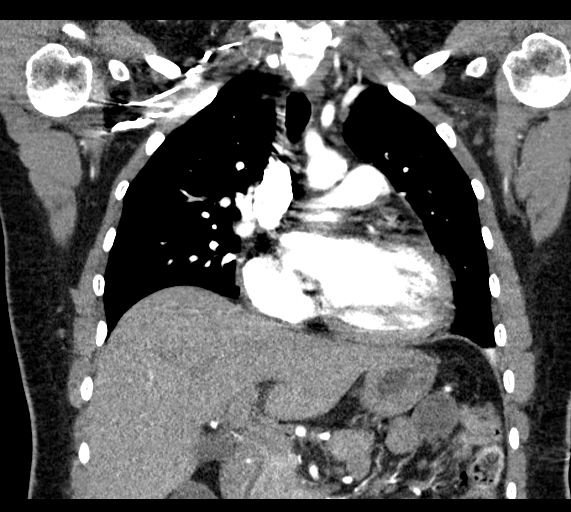

[19 of 36 positions shown; findings below may reference images not displayed]

FINDINGS: Cardiovascular: Satisfactory opacification of the pulmonary arteries
to the segmental level. No evidence of pulmonary embolism. There is
cardiomegaly. No large pericardial effusion.

Mediastinum/Nodes: No enlarged mediastinal, hilar, or axillary lymph
nodes. Thyroid gland, trachea, and esophagus demonstrate no
significant findings.

Lungs/Pleura: Small bilateral right greater than left pleural
effusions. Hazy atelectasis or infiltrates in the right greater than
left lung bases. No pneumothorax.

Upper Abdomen: No acute abnormality.

Musculoskeletal: Post sternotomy changes. No acute osseous
abnormality.

Review of the MIP images confirms the above findings.
IMPRESSION: 1. Negative for acute pulmonary embolus or aortic dissection
2. Small bilateral pleural effusions right greater than left with
streaky atelectasis or infiltrates in the bilateral lower lobes.
3. Cardiomegaly.  Post sternotomy changes.

## 2018-01-08 IMAGING — DX DG CHEST 2V
2 series · 2 of 2 positions shown · non-contrast
Comparison: Chest radiograph August 19, 2016 and chest CT August 21, 2016

CLINICAL DATA: Recent pneumonia. History of congenital
supravalvular aortic stenosis.

EXAM:
CHEST  2 VIEW

[chest pa]
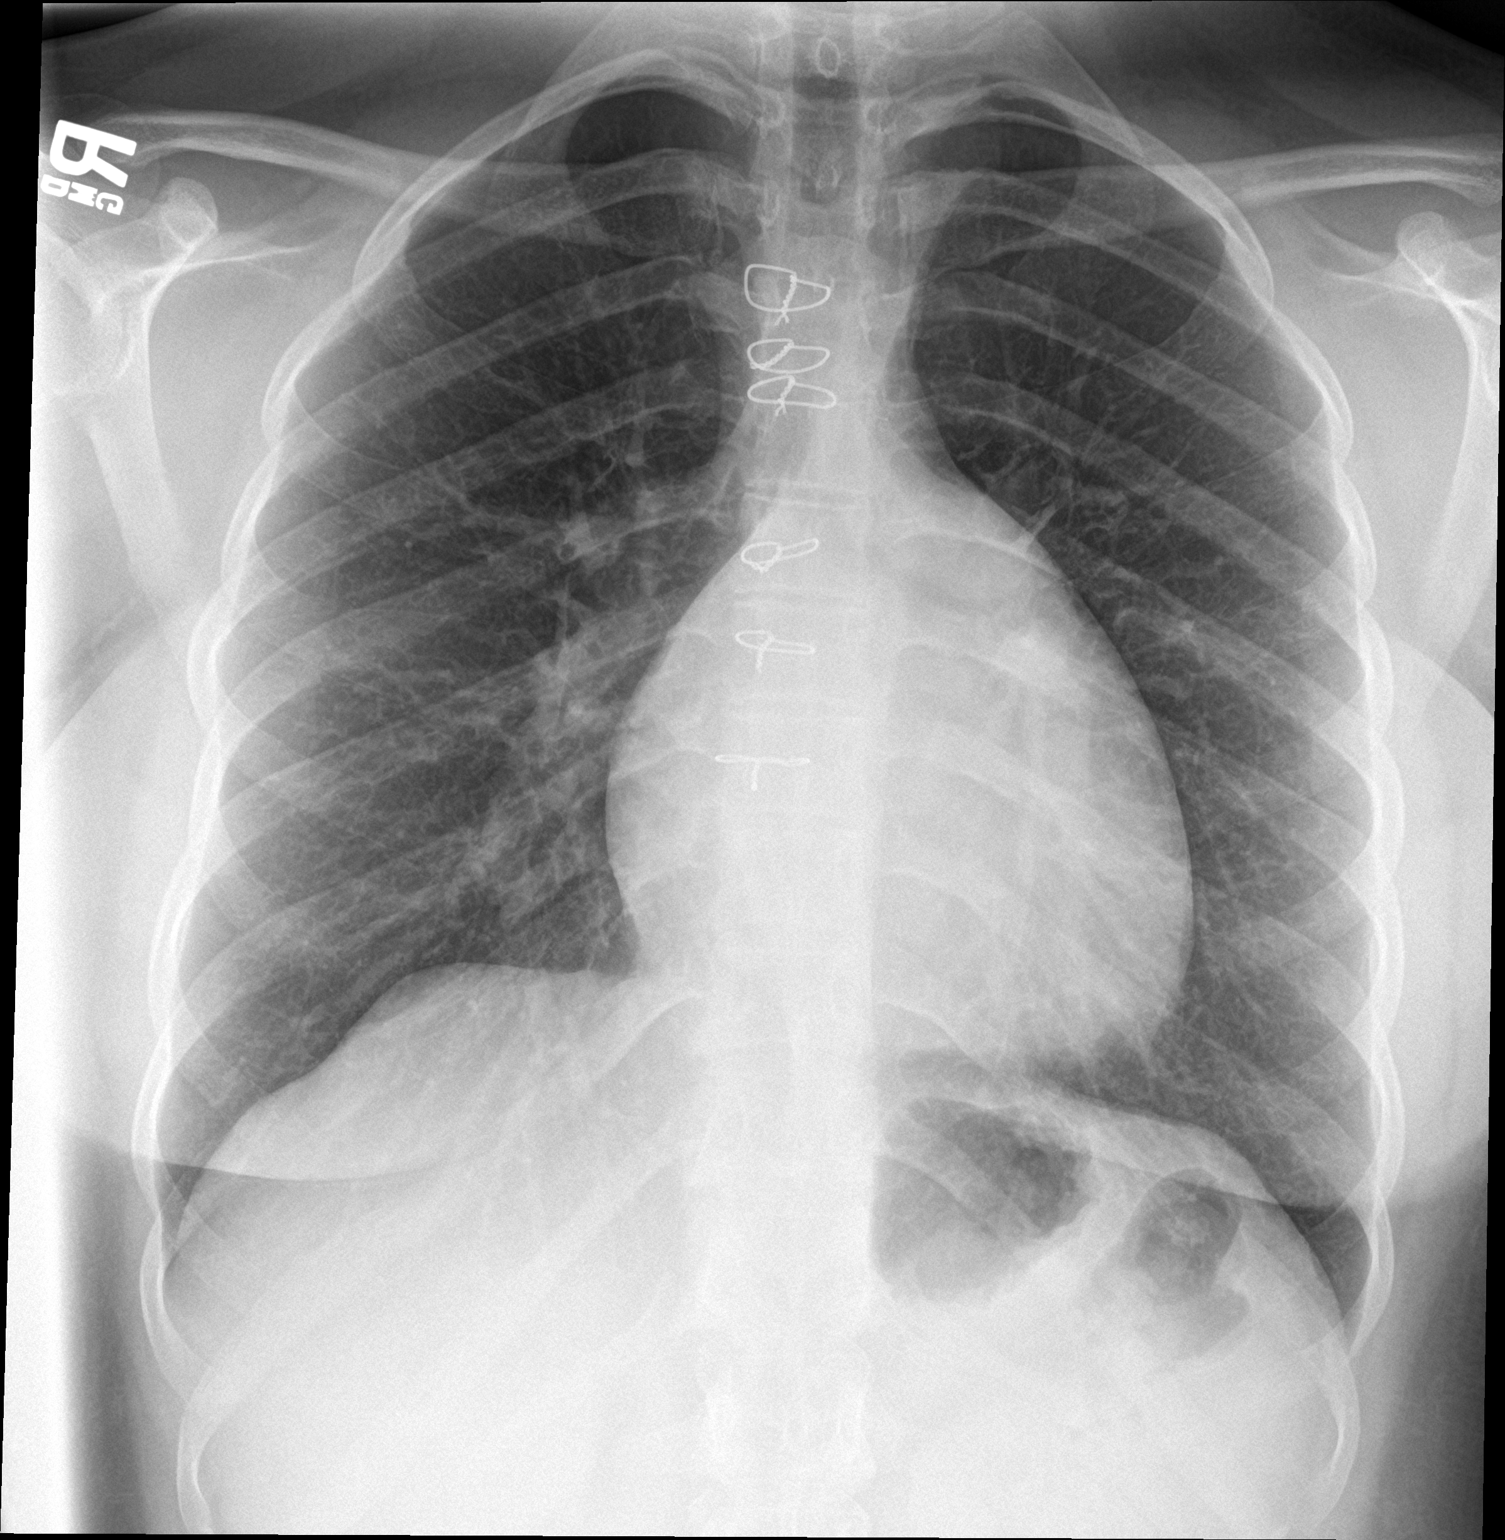

[chest lat]
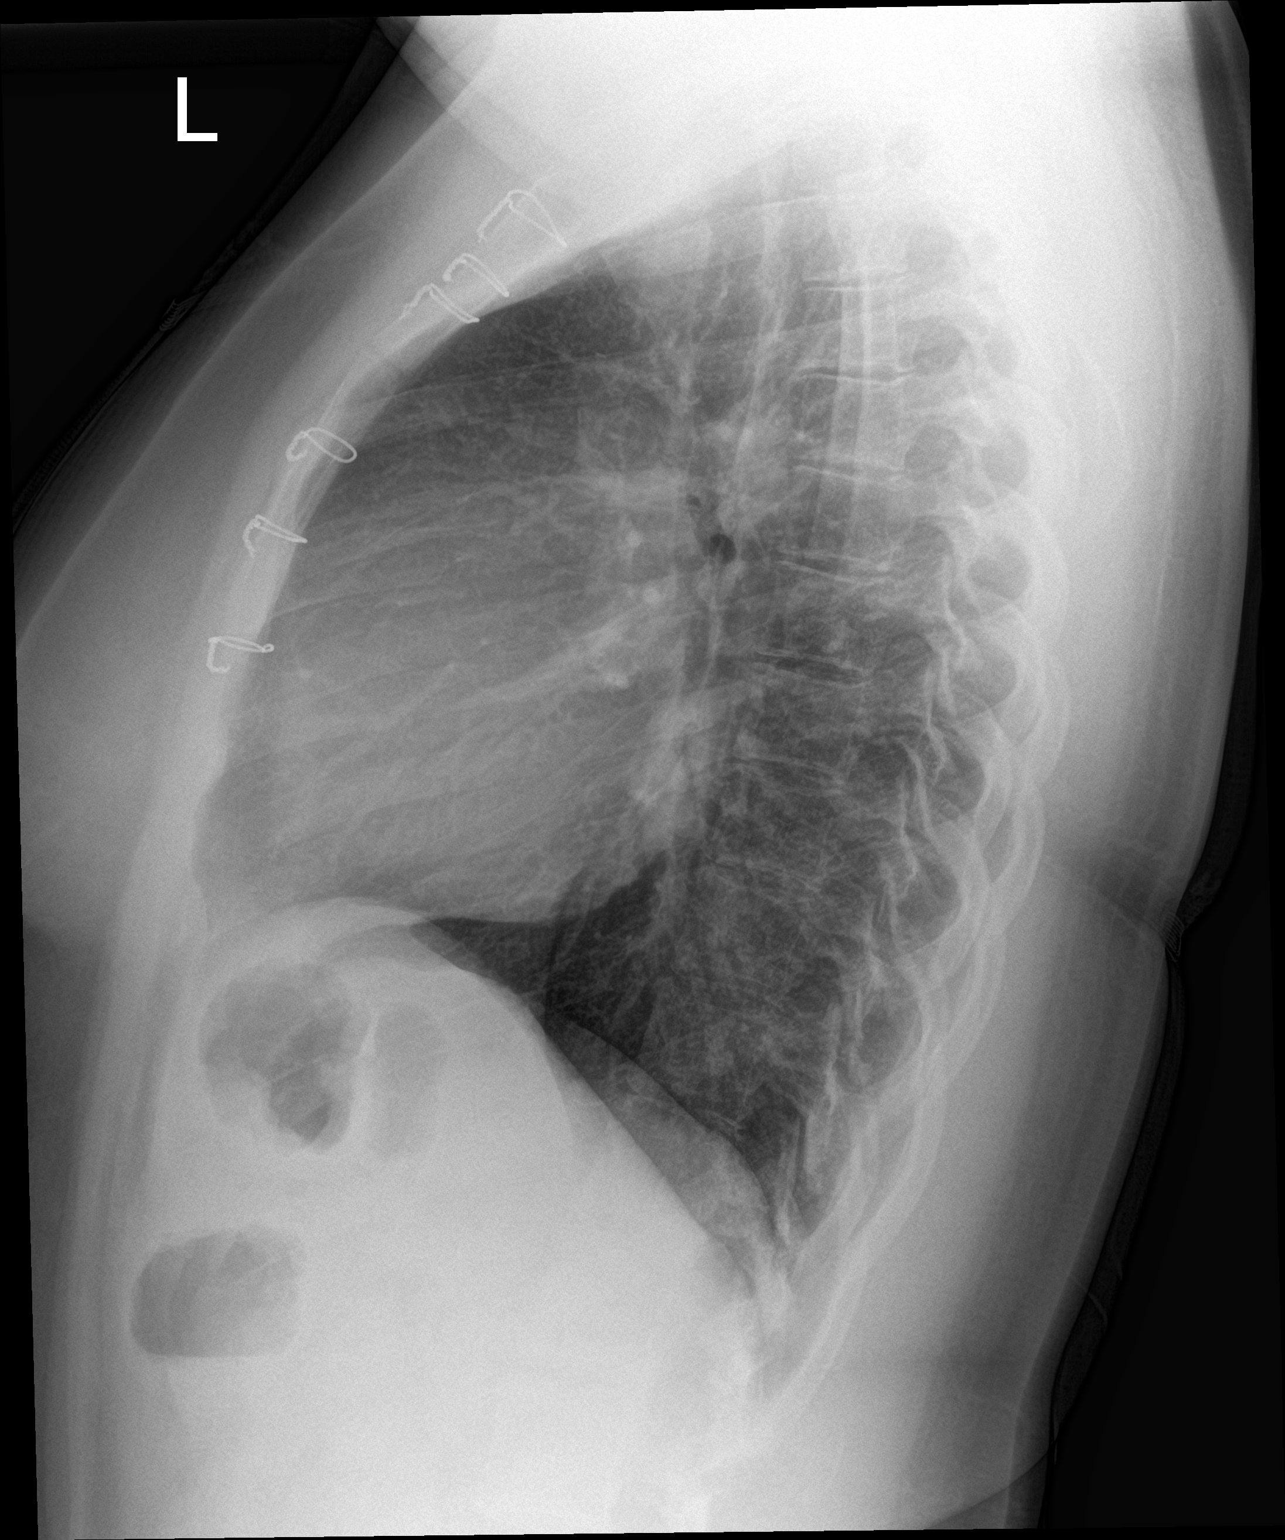

[2 of 2 positions shown; findings below may reference images not displayed]

FINDINGS: There is no edema or consolidation. Heart is borderline enlarged.
The pulmonary vascularity is within normal limits. No adenopathy. No
bone lesions. Patient is status post median sternotomy.
IMPRESSION: Slight cardiac enlargement. No edema or consolidation. Status post
median sternotomy.

## 2018-01-27 DIAGNOSIS — Q253 Supravalvular aortic stenosis: Secondary | ICD-10-CM | POA: Diagnosis not present

## 2018-01-27 DIAGNOSIS — Z8774 Personal history of (corrected) congenital malformations of heart and circulatory system: Secondary | ICD-10-CM | POA: Diagnosis not present

## 2018-01-31 DIAGNOSIS — Z01419 Encounter for gynecological examination (general) (routine) without abnormal findings: Secondary | ICD-10-CM | POA: Diagnosis not present

## 2018-08-14 ENCOUNTER — Encounter (HOSPITAL_COMMUNITY): Payer: Self-pay | Admitting: *Deleted

## 2018-08-14 ENCOUNTER — Ambulatory Visit (HOSPITAL_COMMUNITY)
Admission: EM | Admit: 2018-08-14 | Discharge: 2018-08-14 | Disposition: A | Discharge status: Home / Self Care | Payer: 59 | Attending: Emergency Medicine | Admitting: Emergency Medicine

## 2018-08-14 ENCOUNTER — Other Ambulatory Visit: Payer: Self-pay

## 2018-08-14 DIAGNOSIS — K529 Noninfective gastroenteritis and colitis, unspecified: Secondary | ICD-10-CM | POA: Diagnosis not present

## 2018-08-14 MED ORDER — PROMETHAZINE HCL 25 MG RE SUPP: 25.0000 mg | Freq: Four times a day (QID) | RECTAL | 0 refills | Status: DC | PRN

## 2018-08-14 MED ORDER — ONDANSETRON 4 MG PO TBDP
ORAL_TABLET | ORAL | Status: AC
Start: 2018-08-14 — End: ?
  Filled 2018-08-14: qty 1

## 2018-08-14 MED ORDER — ONDANSETRON 4 MG PO TBDP
4.0000 mg | ORAL_TABLET | Freq: Once | ORAL | Status: AC
  Administered 2018-08-14: 4 mg via ORAL

## 2018-08-14 MED ORDER — ONDANSETRON HCL 4 MG PO TABS: 4.0000 mg | ORAL_TABLET | Freq: Three times a day (TID) | ORAL | 0 refills | Status: DC | PRN

## 2018-08-14 NOTE — ED Provider Notes (Signed)
[facility]    CSN: [id number] Arrival date & time: 08/14/18  1659     History   Chief Complaint Chief Complaint  Patient presents with  . Emesis  . Diarrhea    HPI Tina Miller is a 23 y.o. female.   The history is provided by the patient.  Emesis  Severity:  Severe Duration:  12 hours Timing:  Intermittent Number of daily episodes:  Too numerous to count Quality:  Stomach contents Feeding tolerance: ice chips. Progression:  Unchanged Chronicity:  New Relieved by:  Nothing Worsened by:  Liquids Ineffective treatments:  None tried Associated symptoms: diarrhea   Associated symptoms: no abdominal pain, no arthralgias, no chills, no cough, no fever and no sore throat   Food poisoning: ate a chicken sandwich at [employer] that she suspects might be the casue.   Diarrhea  Associated symptoms: vomiting   Associated symptoms: no abdominal pain, no arthralgias, no chills and no fever     Past Medical History:  Diagnosis Date  . Aortic atresia or stenosis, congenital 2011   [place]  . Seasonal allergies     Patient Active Problem List   Diagnosis Date Noted  . Depo contraception 06/01/2015  . 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 Aortic atresia or stenosis, congenital 02/14/2013    Past Surgical History:  Procedure Laterality Date  . ADENOIDECTOMY  June 2003  . CARDIAC CATHETERIZATION  2008  . LABIOPLASTY  11/19/2011   Procedure: LABIAPLASTY;  Surgeon: Jeani HawkingMichelle L Grewal, MD;  Location: [facility];  Service: Gynecology;  Laterality: Right;  Right Labial Reduction  . TONSILLECTOMY  June 2003    OB History   No obstetric history on file.      Home Medications    Prior to Admission medications   Medication Sig Start Date End Date Taking? Authorizing Provider  ondansetron (ZOFRAN) 4 MG tablet Take 1 tablet (4 mg total) by mouth every 8 (eight) hours as needed  for nausea or vomiting. 08/14/18   Marshell LevanMonroe, Anna G, MD  promethazine (PHENERGAN) 25 MG suppository Place 1 suppository (25 mg total) rectally every 6 (six) hours as needed for nausea or vomiting. 08/14/18   Marshell LevanMonroe, Anna G, MD    Family History Family History  Problem Relation Age of Onset  . Aortic stenosis Mother   . Diabetes Other     Social History Social History   Tobacco Use  . Smoking status: Never Smoker  . Smokeless tobacco: Never Used  Substance Use Topics  . Alcohol use: No  . Drug use: No     Allergies   Lasix [furosemide]   Review of Systems Review of Systems  Constitutional: Negative for chills and fever.  HENT: Negative for ear pain and sore throat.   Eyes: Negative for pain and visual disturbance.  Respiratory: Negative for cough and shortness of breath.   Cardiovascular: Negative for chest pain and palpitations.  Gastrointestinal: Positive for diarrhea and vomiting. Negative for abdominal pain.  Genitourinary: Negative for dysuria and hematuria.  Musculoskeletal: Negative for arthralgias and back pain.  Skin: Negative for color change and rash.  Neurological: Negative for seizures and syncope.  All other systems reviewed and are negative.    Physical Exam Triage Vital Signs ED Triage Vitals  Enc Vitals Group     BP 08/14/18 1720 118/60     Pulse Rate 08/14/18 1720 85     Resp  08/14/18 1720 16     Temp 08/14/18 1720 98.2 F (36.8 C)     Temp Source 08/14/18 1720 Oral     SpO2 08/14/18 1720 100 %     Weight --      Height --      Head Circumference --      Peak Flow --      Pain Score 08/14/18 1721 0     Pain Loc --      Pain Edu? --      Excl. in GC? --    No data found.  Updated Vital Signs BP 118/60   Pulse 85   Temp 98.2 F (36.8 C) (Oral)   Resp 16   LMP 08/12/2018 (Exact Date)   SpO2 100%   Visual Acuity Right Eye Distance:   Left Eye Distance:   Bilateral Distance:    Right Eye Near:   Left Eye Near:    Bilateral Near:      Physical Exam Constitutional:      Appearance: Normal appearance. She is not ill-appearing.  HENT:     Head: Normocephalic and atraumatic.     Nose: Nose normal.     Mouth/Throat:     Mouth: Mucous membranes are moist.  Eyes:     Extraocular Movements: Extraocular movements intact.     Conjunctiva/sclera: Conjunctivae normal.  Pulmonary:     Effort: Pulmonary effort is normal. No respiratory distress.  Abdominal:     General: There is no distension.     Palpations: There is no mass.     Tenderness: There is no abdominal tenderness. There is no guarding.  Musculoskeletal:        General: No swelling or signs of injury.  Skin:    General: Skin is warm and dry.  Neurological:     General: No focal deficit present.     Mental Status: She is alert and oriented to person, place, and time.  Psychiatric:        Mood and Affect: Mood normal.        Behavior: Behavior normal.      UC Treatments / Results  Labs (all labs ordered are listed, but only abnormal results are displayed) Labs Reviewed - No data to display  EKG None  Radiology No results found.  Procedures Procedures (including critical care time)  Medications Ordered in UC Medications  ondansetron (ZOFRAN-ODT) disintegrating tablet 4 mg (4 mg Oral Given 08/14/18 1725)    Initial Impression / Assessment and Plan / UC Course  I have reviewed the triage vital signs and the nursing notes.  Pertinent labs & imaging results that were available during my care of the patient were reviewed by me and considered in my medical decision making (see chart for details).     Patient is well-appearing with normal vital signs.  Likely gastroenteritis.  She was given symptomatic treatment.  I gave her Zofran and also Phenergan.  She is to use the Phenergan only if Zofran is not effective.  She understands not to use both at one time.  Oral rehydration guidelines were discussed with the patient.  I do not suspect intra-abdominal  pathology. Final Clinical Impressions(s) / UC Diagnoses   Final diagnoses:  Gastroenteritis   Discharge Instructions   None    ED Prescriptions    Medication Sig Dispense Auth. Provider   ondansetron (ZOFRAN) 4 MG tablet  (Status: Discontinued) Take 1 tablet (4 mg total) by mouth every 8 (eight) hours as  needed for nausea or vomiting. 4 tablet Marshell Levan, MD   promethazine (PHENERGAN) 25 MG suppository Place 1 suppository (25 mg total) rectally every 6 (six) hours as needed for nausea or vomiting. 12 each Marshell Levan, MD   ondansetron (ZOFRAN) 4 MG tablet Take 1 tablet (4 mg total) by mouth every 8 (eight) hours as needed for nausea or vomiting. 4 tablet Marshell Levan, MD     Controlled Substance Prescriptions [place] Controlled Substance Registry consulted? No   Marshell Levan, MD 08/14/18 [phone]

## 2018-08-14 NOTE — ED Triage Notes (Signed)
C/O n/v/d since this AM without pain.  Unable to keep any PO fluids down.

## 2019-03-19 ENCOUNTER — Encounter (HOSPITAL_COMMUNITY): Payer: Self-pay | Admitting: *Deleted

## 2019-03-19 ENCOUNTER — Other Ambulatory Visit: Payer: Self-pay

## 2019-03-19 ENCOUNTER — Emergency Department (HOSPITAL_COMMUNITY)
Admission: EM | Admit: 2019-03-19 | Discharge: 2019-03-20 | Disposition: A | Discharge status: Home / Self Care | Payer: 59 | Attending: Emergency Medicine | Admitting: Emergency Medicine

## 2019-03-19 DIAGNOSIS — Z79899 Other long term (current) drug therapy: Secondary | ICD-10-CM | POA: Insufficient documentation

## 2019-03-19 DIAGNOSIS — R29810 Facial weakness: Secondary | ICD-10-CM | POA: Diagnosis present

## 2019-03-19 DIAGNOSIS — G510 Bell's palsy: Secondary | ICD-10-CM | POA: Insufficient documentation

## 2019-03-19 MED ORDER — STERILE WATER FOR INJECTION IJ SOLN
INTRAMUSCULAR | Status: AC
  Filled 2019-03-19: qty 10

## 2019-03-19 MED ORDER — PREDNISONE 20 MG PO TABS: 60.0000 mg | ORAL_TABLET | Freq: Every day | ORAL | 0 refills | Status: AC

## 2019-03-19 MED ORDER — VALACYCLOVIR HCL 500 MG PO TABS
1000.0000 mg | ORAL_TABLET | Freq: Once | ORAL | Status: AC
  Administered 2019-03-19: 1000 mg via ORAL
  Filled 2019-03-19: qty 2

## 2019-03-19 MED ORDER — VALACYCLOVIR HCL 1 G PO TABS: 1000.0000 mg | ORAL_TABLET | Freq: Three times a day (TID) | ORAL | 0 refills | Status: AC

## 2019-03-19 MED ORDER — PREDNISONE 20 MG PO TABS
60.0000 mg | ORAL_TABLET | Freq: Once | ORAL | Status: AC
  Administered 2019-03-19: 23:00:00 60 mg via ORAL
  Filled 2019-03-19: qty 3

## 2019-03-19 MED ORDER — FLUORESCEIN SODIUM 1 MG OP STRP
1.0000 | ORAL_STRIP | Freq: Once | OPHTHALMIC | Status: AC
  Administered 2019-03-19: 1 via OPHTHALMIC
  Filled 2019-03-19: qty 1

## 2019-03-19 MED ORDER — TETRACAINE HCL 0.5 % OP SOLN
1.0000 [drp] | Freq: Once | OPHTHALMIC | Status: AC
  Administered 2019-03-19: 1 [drp] via OPHTHALMIC
  Filled 2019-03-19: qty 4

## 2019-03-19 NOTE — ED Triage Notes (Addendum)
Left eye twitching for 2 days. Then noticed that her face does not seem symmetrical since Tuesday, unable to close the left eye, facial palsy on the left side. No extremity weakness noted in triage. No fevers.

## 2019-03-19 NOTE — ED Provider Notes (Signed)
[facility] Provider Note   CSN: [id number] Arrival date & time: 03/19/19  1832     History   Chief Complaint Chief Complaint  Patient presents with  . Facial Pain    HPI Tina Miller is a 23 y.o. female.     Patient presents to the emergency department with a chief complaint of left sided facial tingling and droop.  She states that she noticed the symptoms today, but had some twitching of her left eye lid 2 days ago.  He states that she has difficulty closing her left eyelid all the way.  He states that her smile was crooked.  She denies any treatments prior to arrival.  Denies any other associated symptoms.  Specifically denies no numbness, weakness, or tingling of her extremities.  Denies any slurred speech.  The history is provided by the patient. No language interpreter was used.    Past Medical History:  Diagnosis Date  . Aortic atresia or stenosis, congenital 2011   [place], [place]  . Seasonal allergies     Patient Active Problem List   Diagnosis Date Noted  . Depo contraception 06/01/2015  . 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 Aortic atresia or stenosis, congenital 02/14/2013    Past Surgical History:  Procedure Laterality Date  . ADENOIDECTOMY  June 2003  . CARDIAC CATHETERIZATION  2008  . LABIOPLASTY  11/19/2011   Procedure: LABIAPLASTY;  Surgeon: Cyril Mourning, MD;  Location: [facility];  Service: Gynecology;  Laterality: Right;  Right Labial Reduction  . TONSILLECTOMY  June 2003     OB History   No obstetric history on file.      Home Medications    Prior to Admission medications   Medication Sig Start Date End Date Taking? Authorizing Provider  ondansetron (ZOFRAN) 4 MG tablet Take 1 tablet (4 mg total) by mouth every 8 (eight) hours as needed for nausea or vomiting. 08/14/18   Katy Fitch, MD  promethazine (PHENERGAN) 25  MG suppository Place 1 suppository (25 mg total) rectally every 6 (six) hours as needed for nausea or vomiting. 08/14/18   Katy Fitch, MD    Family History Family History  Problem Relation Age of Onset  . Aortic stenosis Mother   . Diabetes Other     Social History Social History   Tobacco Use  . Smoking status: Never Smoker  . Smokeless tobacco: Never Used  Substance Use Topics  . Alcohol use: No  . Drug use: No     Allergies   Lasix [furosemide]   Review of Systems Review of Systems  All other systems reviewed and are negative.    Physical Exam Updated Vital Signs BP 111/81 (BP Location: Right Arm)   Pulse 64   Temp 99.3 F (37.4 C) (Oral)   Resp 19   SpO2 100%   Physical Exam Vitals signs and nursing note reviewed.  Constitutional:      General: She is not in acute distress.    Appearance: She is well-developed.  HENT:     Head: Normocephalic and atraumatic.     Comments: Left-sided facial droop, softening of the left nasolabial fold, decreased forehead lines, unable to close left eyelid 100%  No fluorescein uptake, no evidence of corneal abrasion Eyes:     Conjunctiva/sclera: Conjunctivae normal.  Neck:     Musculoskeletal: Neck supple.  Cardiovascular:  Rate and Rhythm: Normal rate and regular rhythm.     Heart sounds: No murmur.  Pulmonary:     Effort: Pulmonary effort is normal. No respiratory distress.     Breath sounds: Normal breath sounds.  Abdominal:     Palpations: Abdomen is soft.     Tenderness: There is no abdominal tenderness.  Musculoskeletal: Normal range of motion.     Comments: Range of motion strength bilateral upper and lower extremities 5/5  Skin:    General: Skin is warm and dry.  Neurological:     Mental Status: She is alert and oriented to person, place, and time.  Psychiatric:        Mood and Affect: Mood normal.        Behavior: Behavior normal.      ED Treatments / Results  Labs (all labs ordered are  listed, but only abnormal results are displayed) Labs Reviewed - No data to display  EKG None  Radiology No results found.  Procedures Procedures (including critical care time)  Medications Ordered in ED Medications  tetracaine (PONTOCAINE) 0.5 % ophthalmic solution 1 drop (has no administration in time range)  fluorescein ophthalmic strip 1 strip (has no administration in time range)  valACYclovir (VALTREX) tablet 1,000 mg (has no administration in time range)  predniSONE (DELTASONE) tablet 60 mg (has no administration in time range)     Initial Impression / Assessment and Plan / ED Course  I have reviewed the triage vital signs and the nursing notes.  Pertinent labs & imaging results that were available during my care of the patient were reviewed by me and considered in my medical decision making (see chart for details).        Patient with symptoms consistent with Bell's palsy.  She has left-sided facial droop involving the forehead.  No corneal abrasions.  Given advice regarding taking her eyelid closed and using artificial tears throughout the day.  Will treat with Valtrex and prednisone.  Recommend neurology follow-up.  Patient understands and agrees stable and ready for discharge.  Final Clinical Impressions(s) / ED Diagnoses   Final diagnoses:  Bell's palsy    ED Discharge Orders         Ordered    valACYclovir (VALTREX) 1000 MG tablet  3 times daily     03/19/19 2322    predniSONE (DELTASONE) 20 MG tablet  Daily     03/19/19 2322           Roxy Horseman, PA-C 03/19/19 2338    Geoffery Lyons, MD 03/20/19 [phone]

## 2019-03-19 NOTE — ED Notes (Signed)
Pt recently lost her grandmother on Sunday.  She had a "twitch" in her left eye beginning Monday and she noted a paralysis of her left face on Wednesday.  No numbness or tingling with this.  No other neuro deficits.  PA is now at the bedside.  Gave pt call bell.

## 2019-07-12 ENCOUNTER — Encounter: Payer: Self-pay | Admitting: Family Medicine

## 2019-07-13 ENCOUNTER — Encounter: Payer: Self-pay | Admitting: Family Medicine

## 2019-07-27 ENCOUNTER — Other Ambulatory Visit: Payer: Self-pay

## 2019-07-27 ENCOUNTER — Ambulatory Visit (INDEPENDENT_AMBULATORY_CARE_PROVIDER_SITE_OTHER): Payer: 59 | Admitting: Family Medicine

## 2019-07-27 DIAGNOSIS — B349 Viral infection, unspecified: Secondary | ICD-10-CM | POA: Diagnosis not present

## 2019-07-27 NOTE — Progress Notes (Signed)
   Subjective:    Patient ID: Tina Miller, female    DOB: 03/26/1996, 24 y.o.   MRN: [id number]  Sore Throat  This is a new problem. Episode onset: 2 -3 days. Associated symptoms include congestion and coughing. Pertinent negatives include no ear pain or shortness of breath. Associated symptoms comments: sneezing. Treatments tried: mucinex.  Patient with sore throat sneezing congestion not feeling good past few days denies wheezing difficulty breathing no vomiting or diarrhea.  PMH benign Virtual Visit via Telephone Note  I connected with Kenia B Messerschmidt on 07/27/19 at  3:00 PM EST by telephone and verified that I am speaking with the correct person using two identifiers.  Location: Patient: home Provider: office   I discussed the limitations, risks, security and privacy concerns of performing an evaluation and management service by telephone and the availability of in person appointments. I also discussed with the patient that there may be a patient responsible charge related to this service. The patient expressed understanding and agreed to proceed.   History of Present Illness:    Observations/Objective:   Assessment and Plan:   Follow Up Instructions:    I discussed the assessment and treatment plan with the patient. The patient was provided an opportunity to ask questions and all were answered. The patient agreed with the plan and demonstrated an understanding of the instructions.   The patient was advised to call back or seek an in-person evaluation if the symptoms worsen or if the condition fails to improve as anticipated.  I provided 18 minutes of non-face-to-face time during this encounter.     Review of Systems  Constitutional: Negative for activity change and fever.  HENT: Positive for congestion and rhinorrhea. Negative for ear pain.   Eyes: Negative for discharge.  Respiratory: Positive for cough. Negative for shortness of breath and wheezing.   Cardiovascular:  Negative for chest pain.       Objective:   Physical Exam  Today's visit was via telephone Physical exam was not possible for this visit       Assessment & Plan:  I encouraged the patient to wear a mask stay away from others go get Covid testing it is possible this could be a simple virus it would be best for her to lay low until she has a result of the test if she gets worse she is to let us know

## 2019-07-28 ENCOUNTER — Ambulatory Visit: Payer: 59 | Attending: Internal Medicine

## 2019-07-28 ENCOUNTER — Encounter: Payer: Self-pay | Admitting: Family Medicine

## 2019-07-28 DIAGNOSIS — Z20822 Contact with and (suspected) exposure to covid-19: Secondary | ICD-10-CM

## 2019-07-29 LAB — NOVEL CORONAVIRUS, NAA: SARS-CoV-2, NAA: NOT DETECTED

## 2019-07-31 ENCOUNTER — Telehealth: Payer: Self-pay | Admitting: Family Medicine

## 2019-07-31 NOTE — Telephone Encounter (Signed)
Please advise. Thank you

## 2019-07-31 NOTE — Telephone Encounter (Signed)
Patient had a virtual appt on Thursday. Patient's Covid test came back negative. Still having congestion, cough, runny nose.  Thinks she needs an antibiotic called in.    [id number]

## 2019-08-01 ENCOUNTER — Telehealth: Payer: Self-pay | Admitting: Family Medicine

## 2019-08-01 MED ORDER — CEFPROZIL 500 MG PO TABS: 500.0000 mg | ORAL_TABLET | Freq: Two times a day (BID) | ORAL | 0 refills | Status: AC

## 2019-08-01 NOTE — Telephone Encounter (Signed)
Prescription sent electronically to pharmacy. Patient notified. 

## 2019-08-01 NOTE — Telephone Encounter (Signed)
Patient states her Covid test was negative but has cough and congestion. She wants to see if you can call in something for it.[facility] [address] in [place]

## 2019-08-01 NOTE — Telephone Encounter (Signed)
Cefzil 500 bid ten d 

## 2019-10-03 ENCOUNTER — Ambulatory Visit: Payer: 59
